# Patient Record
Sex: Female | Born: 1980 | Race: Black or African American | Hispanic: No | Marital: Single | Smoking: Never smoker
Health system: Southern US, Community
[De-identification: ages and names within clinical notes are randomized; demographics above are authoritative.]

## PROBLEM LIST (undated history)

## (undated) DIAGNOSIS — I10 Essential (primary) hypertension: Secondary | ICD-10-CM

## (undated) HISTORY — PX: CHOLECYSTECTOMY: SHX55

## (undated) HISTORY — PX: TUBAL LIGATION: SHX77

---

## 2002-05-01 ENCOUNTER — Other Ambulatory Visit: Admission: RE | Admit: 2002-05-01 | Discharge: 2002-05-01 | Payer: Self-pay | Admitting: Gynecology

## 2003-03-10 ENCOUNTER — Encounter: Payer: Self-pay | Admitting: Obstetrics & Gynecology

## 2003-03-10 ENCOUNTER — Inpatient Hospital Stay (HOSPITAL_COMMUNITY): Admission: AD | Admit: 2003-03-10 | Discharge: 2003-03-10 | Payer: Self-pay | Admitting: Obstetrics & Gynecology

## 2003-04-08 ENCOUNTER — Other Ambulatory Visit: Admission: RE | Admit: 2003-04-08 | Discharge: 2003-04-08 | Payer: Self-pay | Admitting: Obstetrics and Gynecology

## 2003-05-06 ENCOUNTER — Other Ambulatory Visit: Admission: RE | Admit: 2003-05-06 | Discharge: 2003-05-06 | Payer: Self-pay | Admitting: Obstetrics and Gynecology

## 2003-06-18 ENCOUNTER — Encounter: Payer: Self-pay | Admitting: Obstetrics and Gynecology

## 2003-06-18 ENCOUNTER — Ambulatory Visit (HOSPITAL_COMMUNITY): Admission: RE | Admit: 2003-06-18 | Discharge: 2003-06-18 | Payer: Self-pay | Admitting: Obstetrics and Gynecology

## 2003-07-22 ENCOUNTER — Ambulatory Visit (HOSPITAL_COMMUNITY): Admission: RE | Admit: 2003-07-22 | Discharge: 2003-07-22 | Payer: Self-pay | Admitting: Obstetrics and Gynecology

## 2003-08-27 ENCOUNTER — Other Ambulatory Visit: Admission: RE | Admit: 2003-08-27 | Discharge: 2003-08-27 | Payer: Self-pay | Admitting: Obstetrics and Gynecology

## 2003-10-07 ENCOUNTER — Ambulatory Visit (HOSPITAL_COMMUNITY): Admission: RE | Admit: 2003-10-07 | Discharge: 2003-10-07 | Payer: Self-pay | Admitting: Obstetrics and Gynecology

## 2003-10-12 ENCOUNTER — Inpatient Hospital Stay (HOSPITAL_COMMUNITY): Admission: AD | Admit: 2003-10-12 | Discharge: 2003-10-12 | Payer: Self-pay | Admitting: *Deleted

## 2003-10-23 ENCOUNTER — Ambulatory Visit (HOSPITAL_COMMUNITY): Admission: RE | Admit: 2003-10-23 | Discharge: 2003-10-23 | Payer: Self-pay | Admitting: Obstetrics and Gynecology

## 2003-10-24 ENCOUNTER — Inpatient Hospital Stay (HOSPITAL_COMMUNITY): Admission: AD | Admit: 2003-10-24 | Discharge: 2003-10-29 | Payer: Self-pay | Admitting: Obstetrics and Gynecology

## 2003-10-25 ENCOUNTER — Encounter (INDEPENDENT_AMBULATORY_CARE_PROVIDER_SITE_OTHER): Payer: Self-pay | Admitting: Specialist

## 2003-12-16 ENCOUNTER — Ambulatory Visit (HOSPITAL_COMMUNITY): Admission: RE | Admit: 2003-12-16 | Discharge: 2003-12-16 | Payer: Self-pay | Admitting: Obstetrics and Gynecology

## 2004-01-26 IMAGING — US US OB FOLLOW-UP
1 series · 13 of 28 positions shown · non-contrast
Comparison: none

CLINICAL DATA: Follow-up fetal anatomy.  G1 P0.

[Series 1: unknown · 0.38mm/px · 13 of 78 slices shown]
[im 3/78]
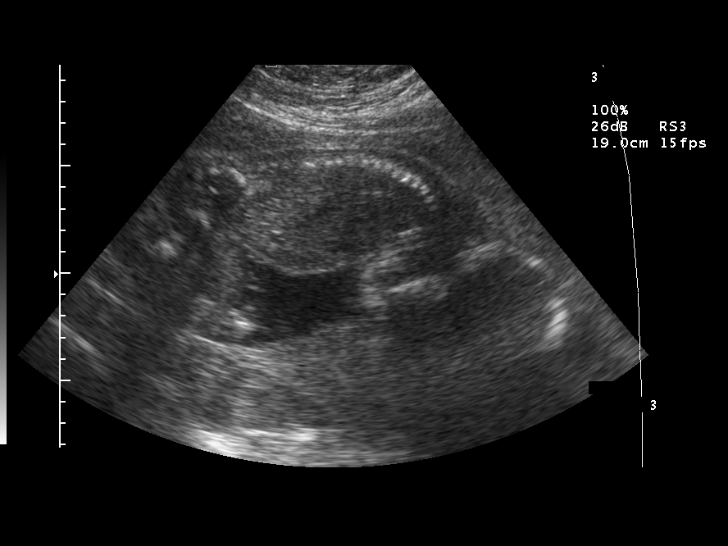
[im 9/78]
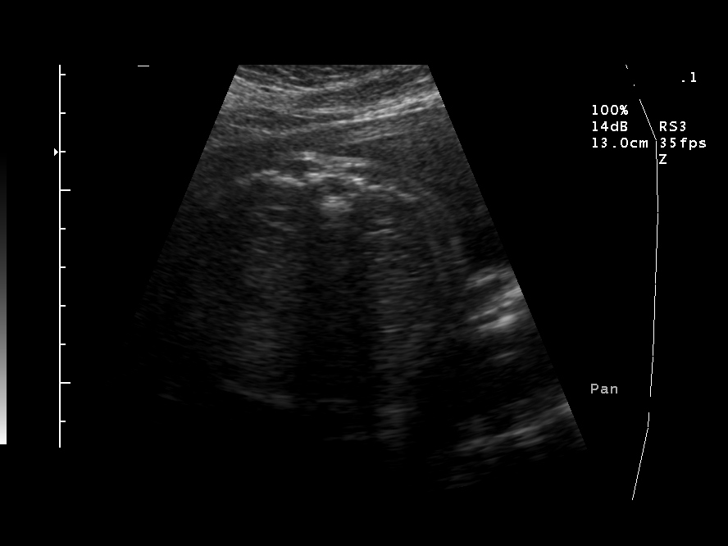
[im 15/78]
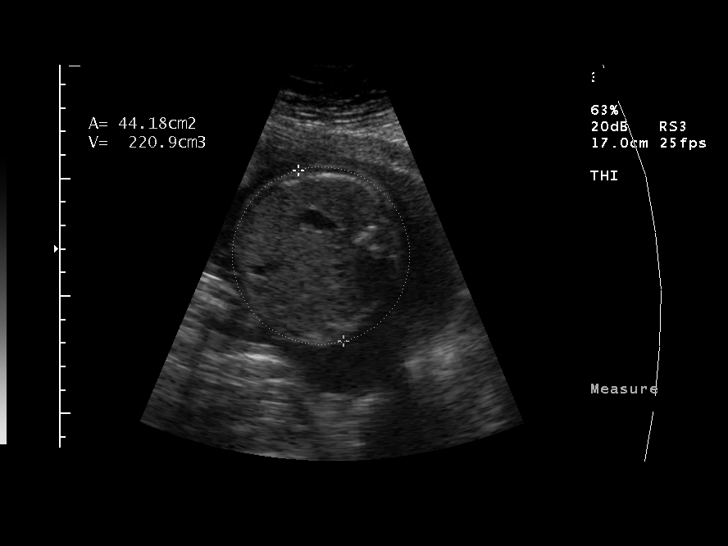
[im 20/78]
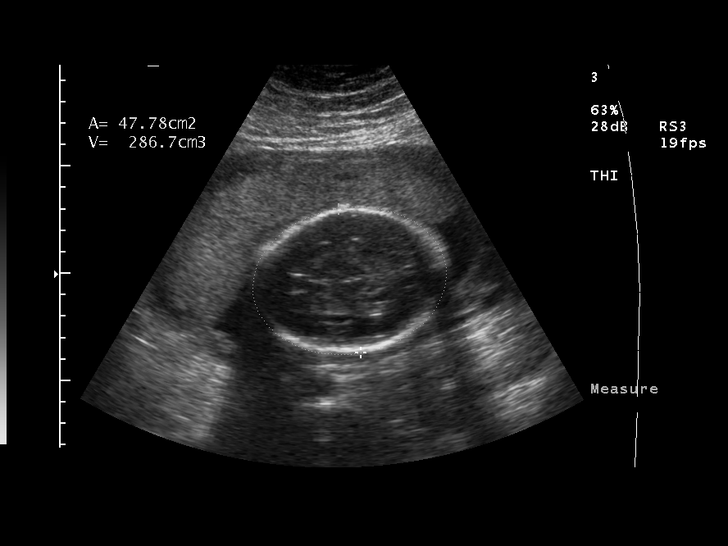
[im 26/78]
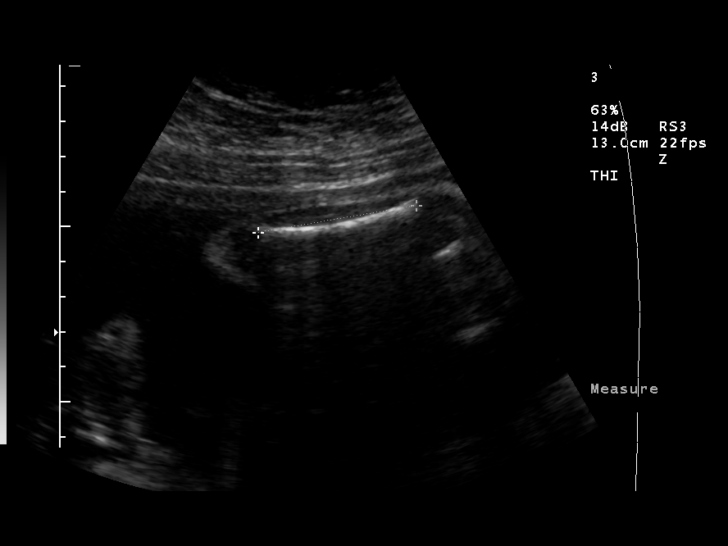
[im 32/78]
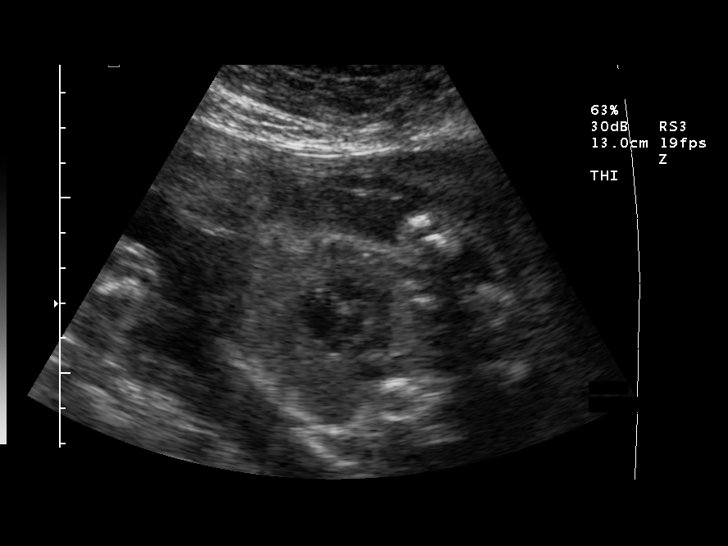
[im 40/78]
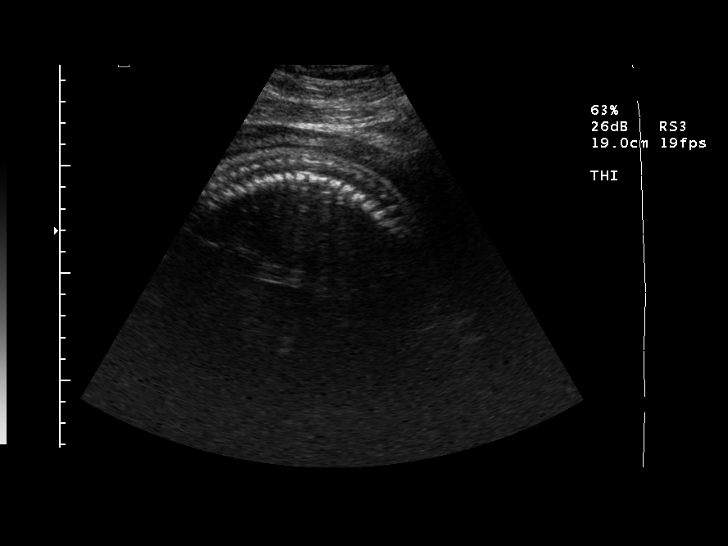
[im 46/78]
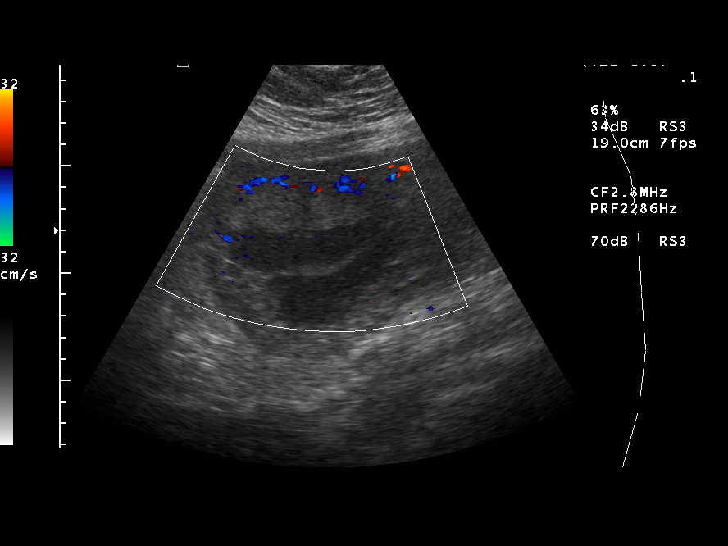
[im 52/78]
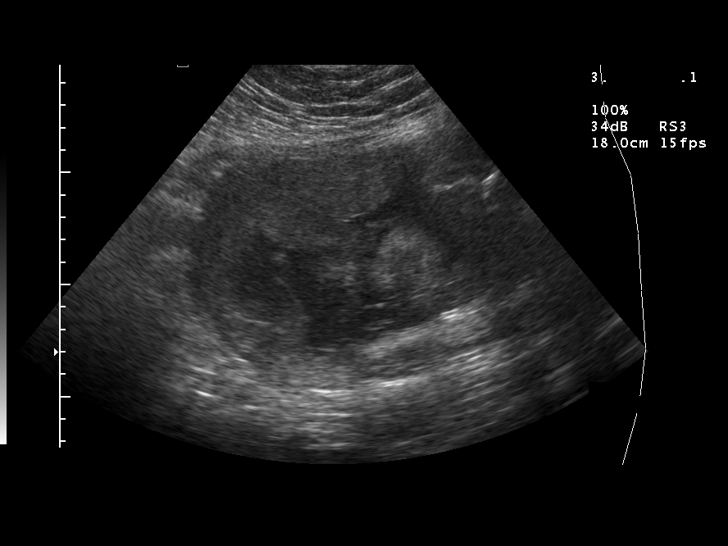
[im 58/78]
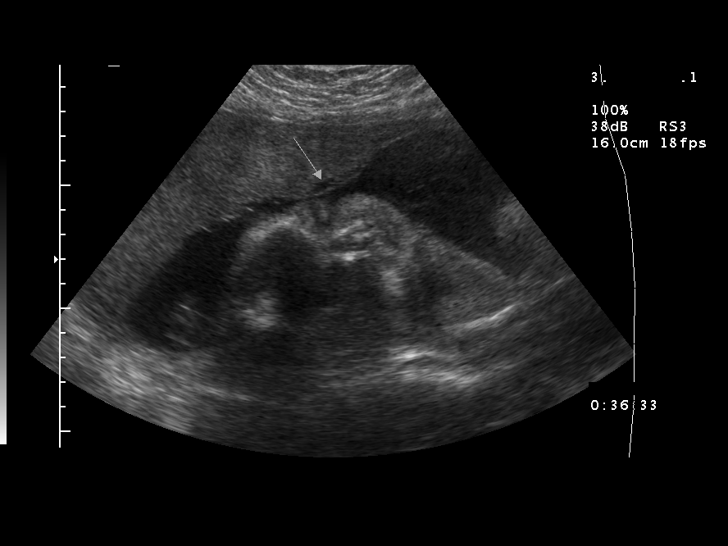
[im 63/78]
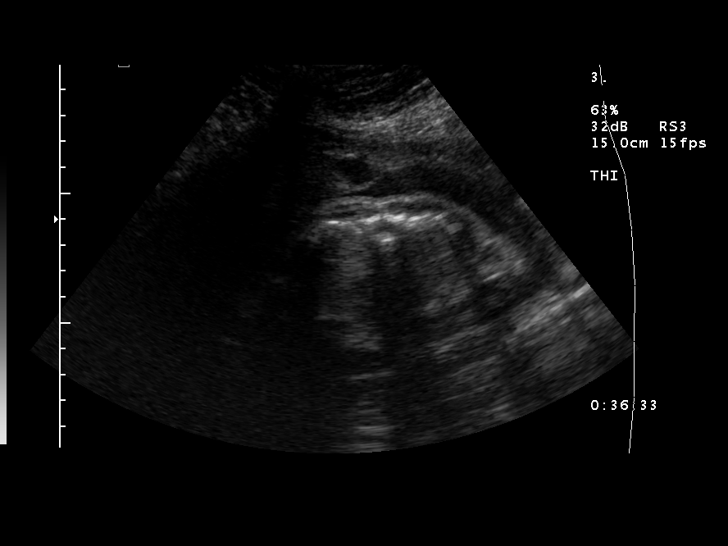
[im 69/78]
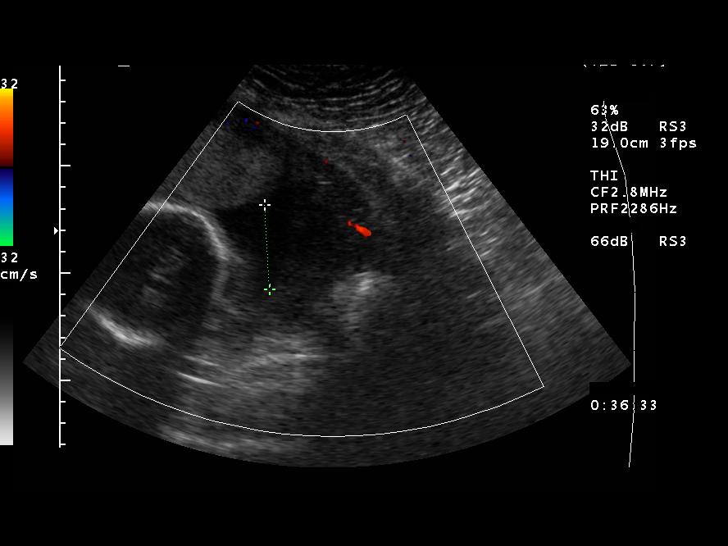
[im 75/78]
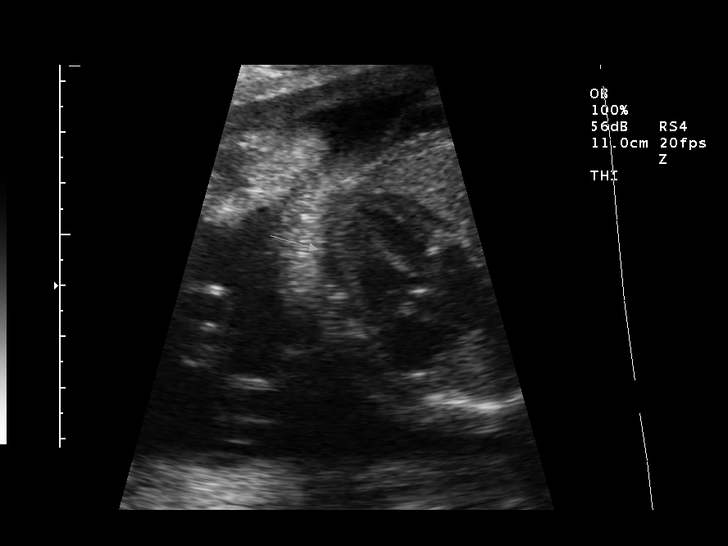

[13 of 28 positions shown; findings below may reference images not displayed]

OBSTETRICAL ULTRASOUND RE-EVALUATION

 NUMBER OF FETUSES:  1
 HEART RATE:  145  
 MOVEMENT: Yes
 BREATHING:  No
 PRESENTATION:  Variable
 PLACENTAL LOCATION:  Anterior
 GRADE:  I
 PREVIA:  No
 AMNIOTIC FLUID (subjective):  Normal
 AMNIOTIC FLUID (objective):  3.9 cm Vertical pocket 

 FETAL BIOMETRY
 BPD:  6.5 cm  26 w 3 d
 HC:  24.9 cm  27 w 0 d
 AC:  23.5 cm  27 w 6 d
 FL:  4.6 cm  25 w 1 d
 MEAN GA:  26 w 4 d
 ASSIGNED GA:  25 w 0 d (1st US)
 EFW:  993 g (H) Greater than 95th%ile ( 95th = 968 grams)  For 25 weeks

 FETAL ANATOMY
 LATERAL VENTRICLES:  Visualized 
 THALAMI/CSP:  Visualized 
 POSTERIOR FOSSA:  Visualized 
 NUCHAL REGION:  N/A
 SPINE:  Visualized 
 4 CHAMBER HEART ON LEFT:  Visualized 
 STOMACH ON LEFT:  Visualized 
 3 VESSEL CORD:  Visualized 
 CORD INSERTION SITE:  Visualized 
 KIDNEYS:  Visualized 
 BLADDER:  Visualized 
 EXTREMITIES:  Previously seen 

 ADDITIONAL ANATOMY VISUALIZED:  LVOT, RVOT, diaphragm, 5th digit, ductal arch, and aortic arch.

 EVALUATION LIMITED BY:    Maternal habitus, fetal position and advanced gestational age.

 MATERNAL FINDINGS
 CERVIX:  3.7 cm Transabdominally
IMPRESSION: Single living intrauterine fetus in variable presentation.  Patient is 25 weeks by first ultrasound at 5 weeks 6 days.  She measures 26 weeks 4 days today with a fetal weight of 993 grams which falls above the 95th percentile for 25 weeks.  One may wish to consider follow-up studies to assess interval growth.
 No focal fetal or placental abnormality identified although one should note that evaluation is limited due to maternal body habitus.

## 2004-02-16 ENCOUNTER — Emergency Department (HOSPITAL_COMMUNITY): Admission: EM | Admit: 2004-02-16 | Discharge: 2004-02-16 | Payer: Self-pay | Admitting: Emergency Medicine

## 2004-07-04 ENCOUNTER — Emergency Department (HOSPITAL_COMMUNITY): Admission: EM | Admit: 2004-07-04 | Discharge: 2004-07-04 | Payer: Self-pay | Admitting: Emergency Medicine

## 2004-07-06 ENCOUNTER — Emergency Department (HOSPITAL_COMMUNITY): Admission: EM | Admit: 2004-07-06 | Discharge: 2004-07-06 | Payer: Self-pay | Admitting: Family Medicine

## 2005-10-07 ENCOUNTER — Emergency Department (HOSPITAL_COMMUNITY): Admission: EM | Admit: 2005-10-07 | Discharge: 2005-10-07 | Payer: Self-pay | Admitting: Emergency Medicine

## 2005-10-09 ENCOUNTER — Other Ambulatory Visit: Admission: RE | Admit: 2005-10-09 | Discharge: 2005-10-09 | Payer: Self-pay | Admitting: Obstetrics and Gynecology

## 2005-11-01 ENCOUNTER — Ambulatory Visit (HOSPITAL_COMMUNITY): Admission: RE | Admit: 2005-11-01 | Discharge: 2005-11-01 | Payer: Self-pay | Admitting: Obstetrics and Gynecology

## 2005-12-13 ENCOUNTER — Ambulatory Visit (HOSPITAL_COMMUNITY): Admission: RE | Admit: 2005-12-13 | Discharge: 2005-12-13 | Payer: Self-pay | Admitting: Obstetrics and Gynecology

## 2006-02-09 ENCOUNTER — Ambulatory Visit (HOSPITAL_COMMUNITY): Admission: RE | Admit: 2006-02-09 | Discharge: 2006-02-09 | Payer: Self-pay | Admitting: Obstetrics and Gynecology

## 2006-02-26 ENCOUNTER — Inpatient Hospital Stay (HOSPITAL_COMMUNITY): Admission: AD | Admit: 2006-02-26 | Discharge: 2006-02-27 | Payer: Self-pay | Admitting: Obstetrics and Gynecology

## 2006-03-20 ENCOUNTER — Ambulatory Visit (HOSPITAL_COMMUNITY): Admission: RE | Admit: 2006-03-20 | Discharge: 2006-03-20 | Payer: Self-pay | Admitting: Obstetrics and Gynecology

## 2006-03-30 ENCOUNTER — Ambulatory Visit (HOSPITAL_COMMUNITY): Admission: RE | Admit: 2006-03-30 | Discharge: 2006-03-30 | Payer: Self-pay | Admitting: Obstetrics and Gynecology

## 2006-04-17 ENCOUNTER — Ambulatory Visit (HOSPITAL_COMMUNITY): Admission: RE | Admit: 2006-04-17 | Discharge: 2006-04-17 | Payer: Self-pay | Admitting: Obstetrics and Gynecology

## 2006-04-24 ENCOUNTER — Ambulatory Visit (HOSPITAL_COMMUNITY): Admission: RE | Admit: 2006-04-24 | Discharge: 2006-04-24 | Payer: Self-pay | Admitting: Obstetrics and Gynecology

## 2006-04-25 ENCOUNTER — Inpatient Hospital Stay (HOSPITAL_COMMUNITY): Admission: AD | Admit: 2006-04-25 | Discharge: 2006-04-28 | Payer: Self-pay | Admitting: Obstetrics and Gynecology

## 2006-04-25 ENCOUNTER — Encounter (INDEPENDENT_AMBULATORY_CARE_PROVIDER_SITE_OTHER): Payer: Self-pay | Admitting: Specialist

## 2006-06-05 ENCOUNTER — Other Ambulatory Visit: Admission: RE | Admit: 2006-06-05 | Discharge: 2006-06-05 | Payer: Self-pay | Admitting: Obstetrics and Gynecology

## 2006-06-19 IMAGING — US US OB COMP +14 WK
1 series · 13 of 28 positions shown · non-contrast
Comparison: none

CLINICAL DATA: 20 week 4 day assigned gestational age by prior ultrasound.  Maternal obesity.  Evaluate fetal anatomy and growth.

[Series 1: us ob comp +14 wk · 0.43mm/px · 13 of 68 slices shown]
[im 3/68]
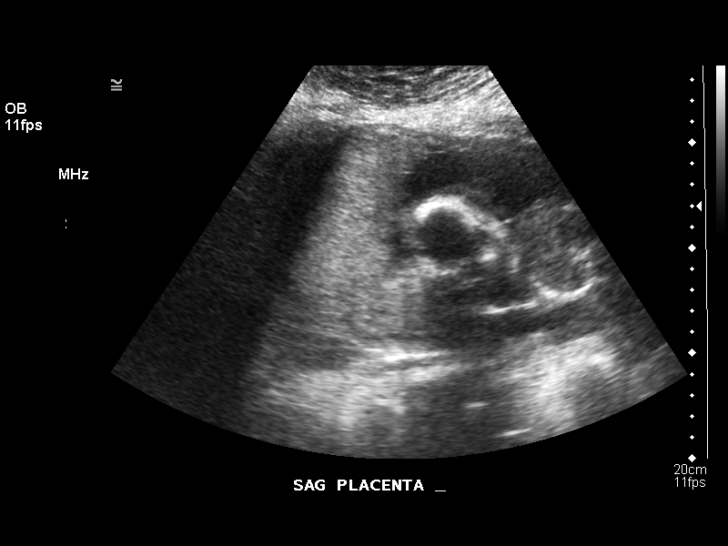
[im 8/68]
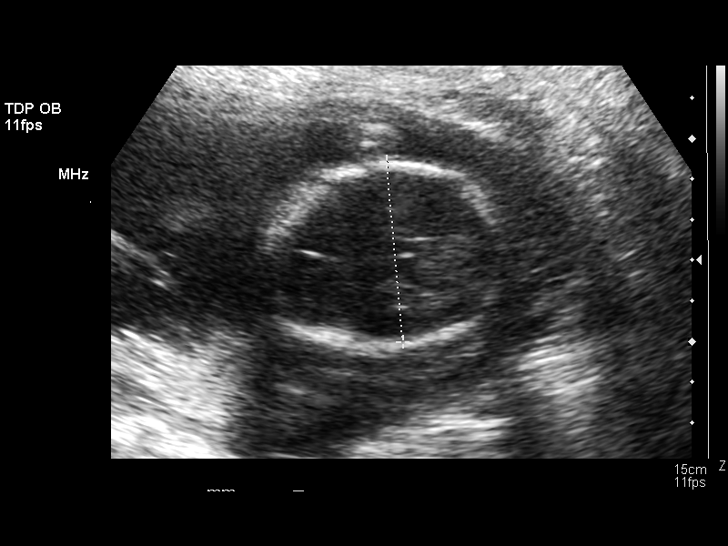
[im 13/68]
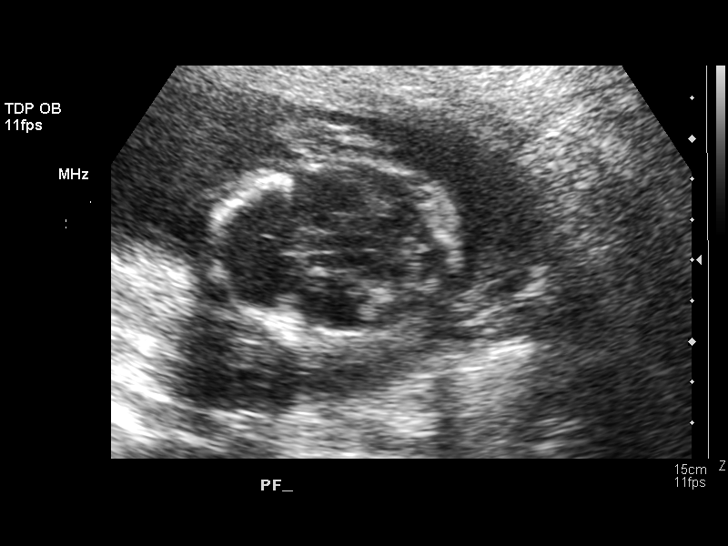
[im 18/68]
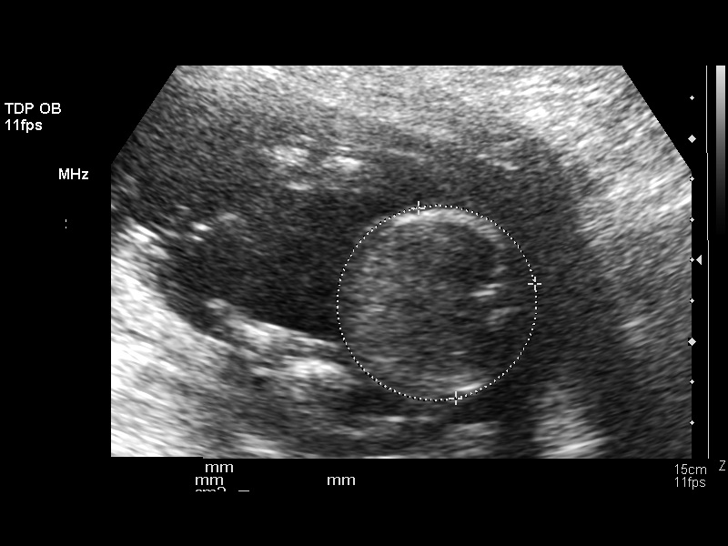
[im 23/68]
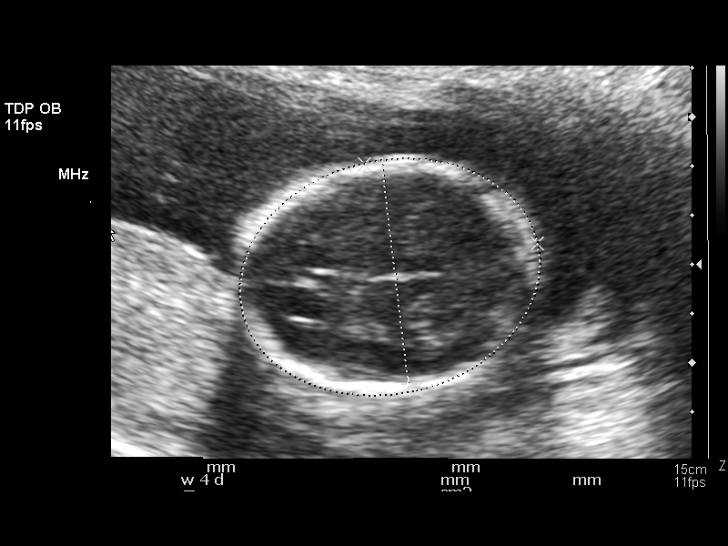
[im 28/68]
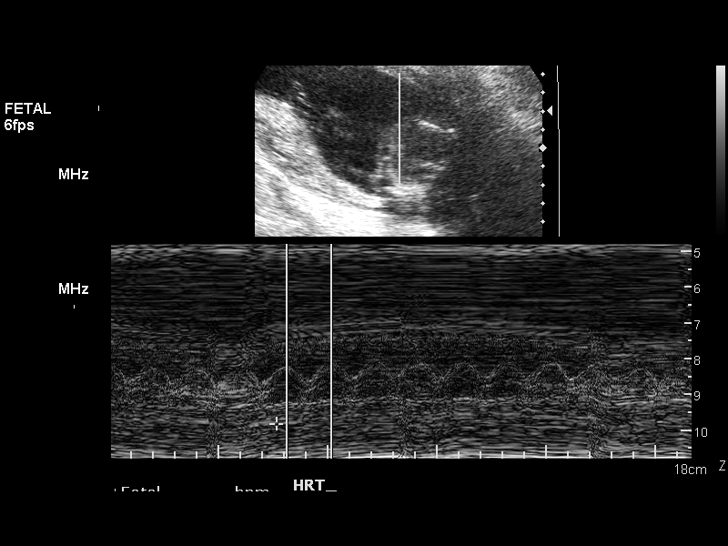
[im 35/68]
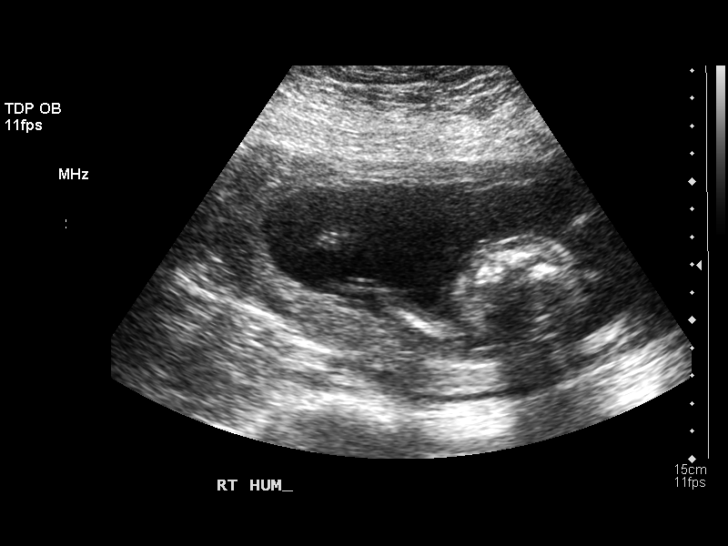
[im 40/68]
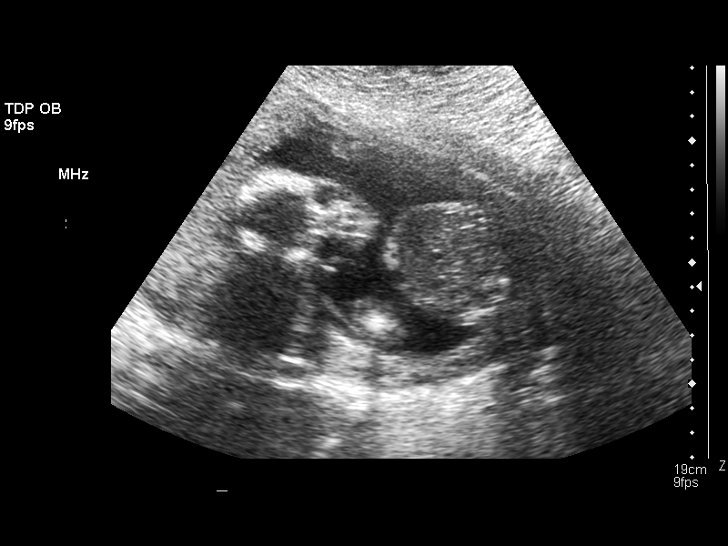
[im 45/68]
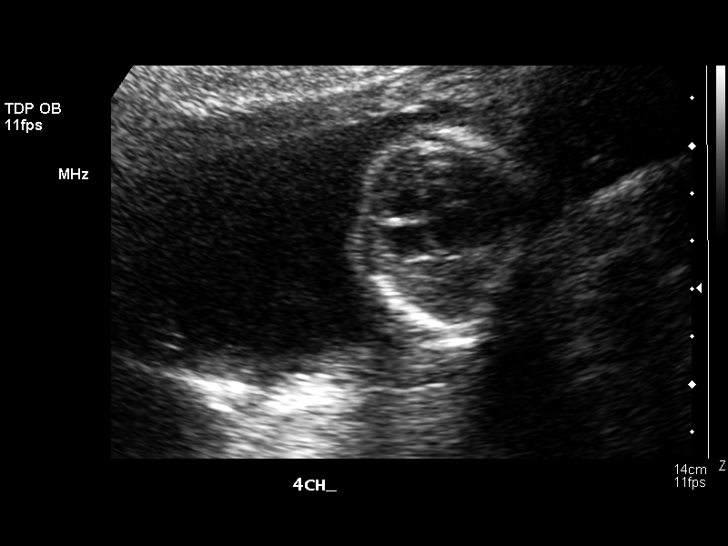
[im 50/68]
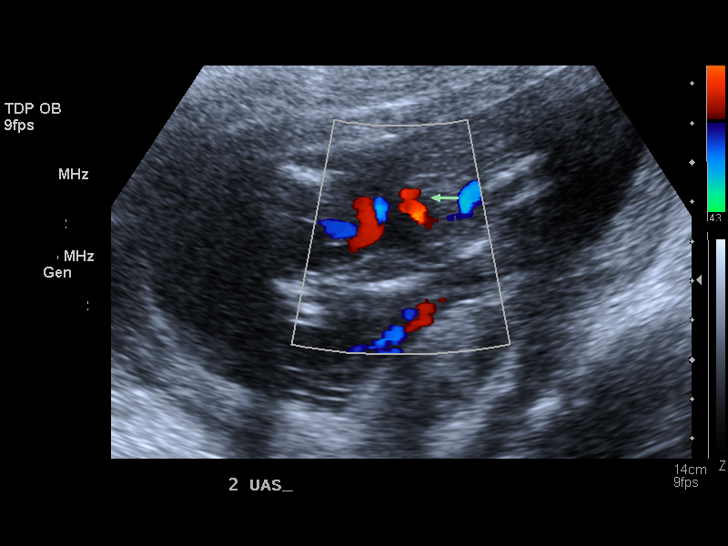
[im 55/68]
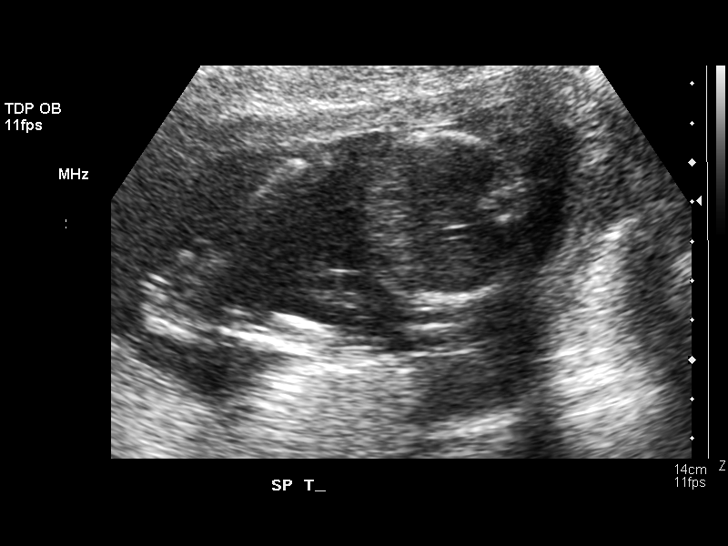
[im 60/68]
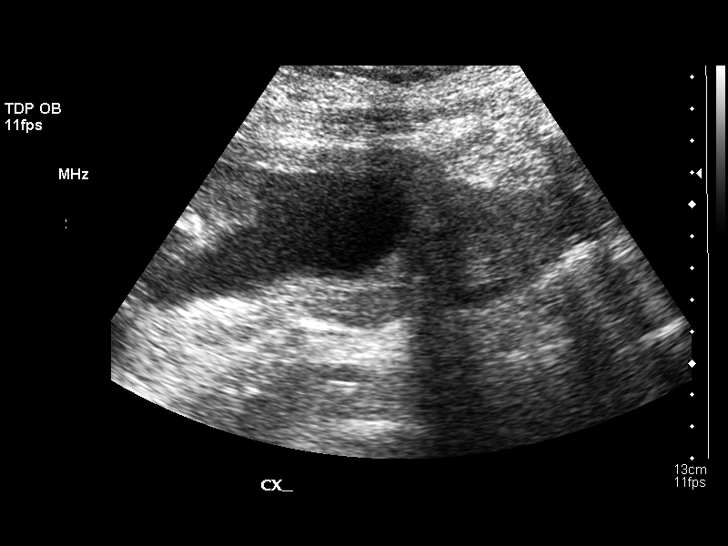
[im 65/68]
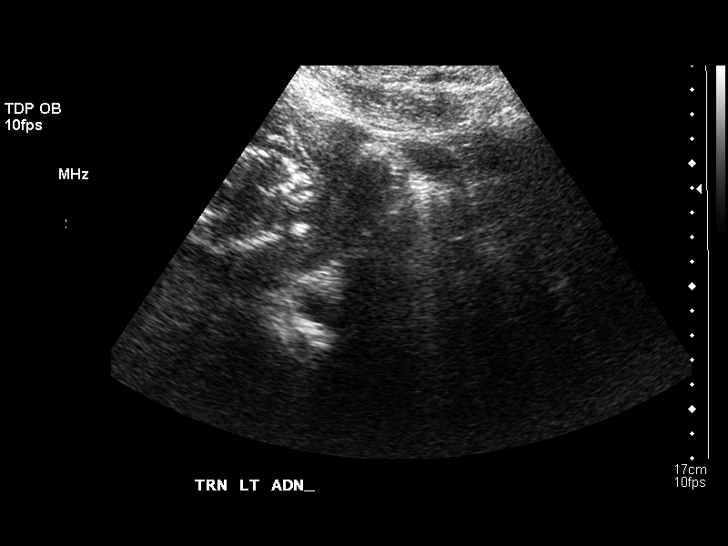

[13 of 28 positions shown; findings below may reference images not displayed]

OBSTETRICAL ULTRASOUND:
 Number of Fetuses: 1
 Heart Rate:  147
 Movement:  Yes
 Breathing:  No  
 Presentation:  Transverse
 Placental Location:  Fundal
 Grade:  I
 Previa: No
 Amniotic Fluid (Subjective): Normal
 Amniotic Fluid (Objective):   5.2 cm Vertical pocket 

 FETAL BIOMETRY
 BPD:   4.5 cm   19 w 3 d
 HC:   16.9 cm   19 w 4 d
 AC:   15.0 cm   20 w 2 d
 FL:    3.2 cm   19 w 6 d

 MEAN GA:  19 w 6 d  US EDC:  05/03/06
 Assigned GA:  20 w 4 d  Assigned EDC:  04/28/06    

 FETAL ANATOMY
 Lateral Ventricles:    Visualized 
 Thalami/CSP:      Visualized 
 Posterior Fossa:  Visualized 
 Nuchal Region:    Visualized 
 Spine:      Visualized 
 4 Chamber Heart on Left:      Visualized 
 Stomach on Left:      Visualized 
 3 Vessel Cord:    Visualized 
 Cord Insertion site:    Visualized 
 Kidneys:  Visualized 
 Bladder:  Visualized 
 Extremities:      Visualized 

 ADDITIONAL ANATOMY VISUALIZED:  RVOT, upper lip, orbits, profile, and diaphragm.  

 Evaluation limited by:  Maternal habitus

 MATERNAL UTERINE AND ADNEXAL FINDINGS
 Cervix: 4.5 cm Transabdominally
IMPRESSION: 1.  Assigned gestational age is currently 20 weeks 4 days by prior ultrasound.  Appropriate fetal growth.
 2.  No evidence of fetal anatomic abnormality.

## 2006-09-24 IMAGING — US US FETAL BPP W/O NONSTRESS
1 series · 5 of 5 positions shown · non-contrast
Comparison: none

CLINICAL DATA: Nonreactive stress test today; assigned gestational age is 33 weeks 4 days.

[Series 1: us fetal bpp w/o nonstress · 5 acquisitions, 5 frames shown]
[im 1/5]
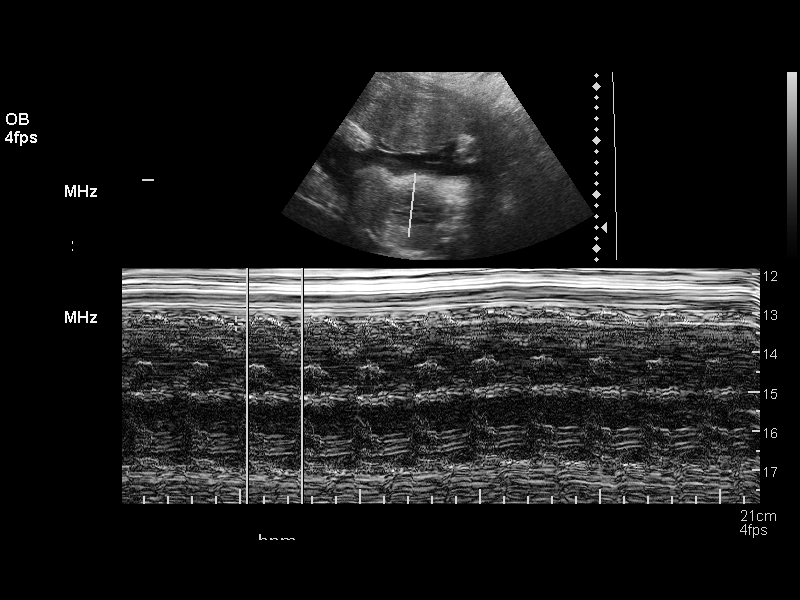
[im 2/5]
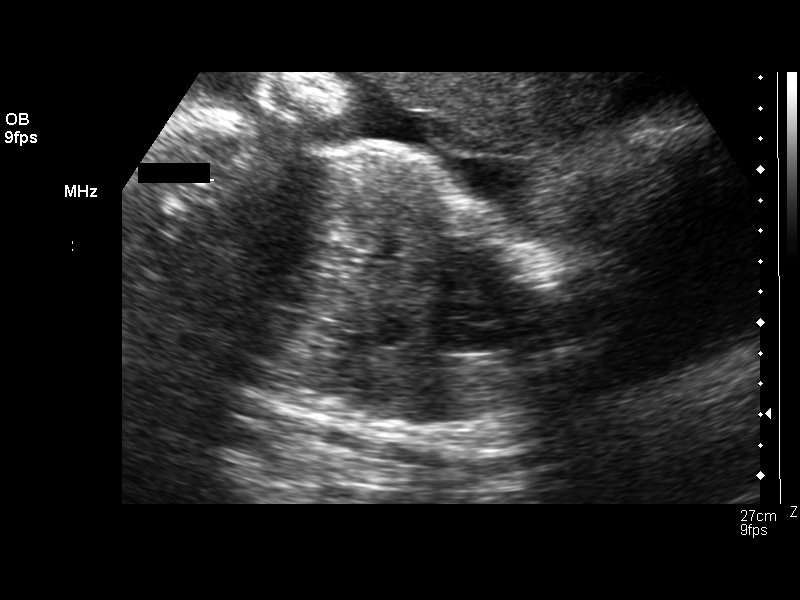
[im 3/5]
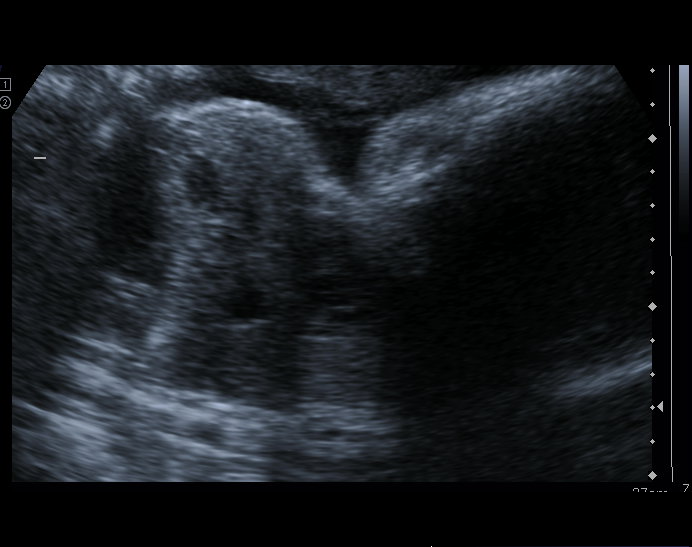
[im 4/5]
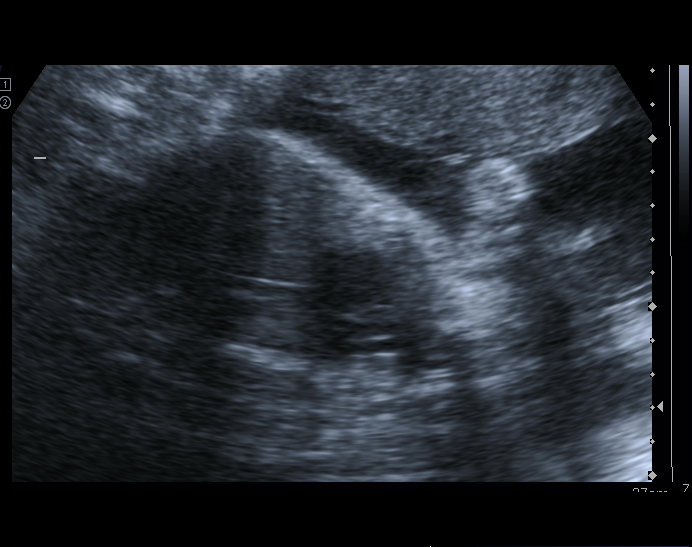
[im 5/5]
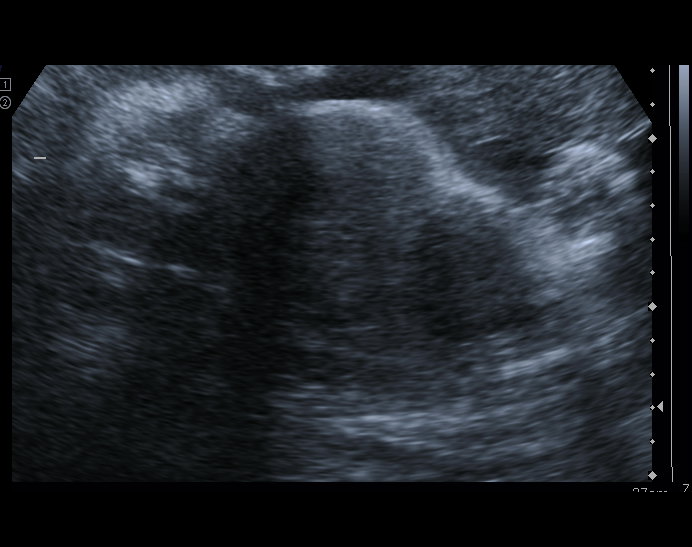

[5 of 5 positions shown; findings below may reference images not displayed]

BIOPHYSICAL PROFILE

 Number of Fetuses: 1
 Heart rate:  131
 Movement:  Yes
 Breathing:  Yes
 Presentation:  Cephalic
 Placental Location:  Anterior
 Grade:  II
 Previa:  No
 Amniotic Fluid (Subjective):  Normal
 Amniotic Fluid (Objective):  23.7 cm AFI 

 Fetal measurements and complete anatomic evaluation were not requested.  

 BPP SCORING
 Movements:  2  Time:  10 minutes
 Breathing:  2
 Tone:  2
 Amniotic Fluid:  2
 Total Score: 8

 MATERNAL UTERINE AND ADNEXAL FINDINGS
 Cervix:  Not evaluated (Performed earlier today)
IMPRESSION: The biophysical profile score is [DATE] over a 10 minute period.

## 2007-10-17 ENCOUNTER — Emergency Department (HOSPITAL_COMMUNITY): Admission: EM | Admit: 2007-10-17 | Discharge: 2007-10-18 | Payer: Self-pay | Admitting: Family Medicine

## 2009-08-24 ENCOUNTER — Ambulatory Visit (HOSPITAL_COMMUNITY): Admission: RE | Admit: 2009-08-24 | Discharge: 2009-08-24 | Payer: Self-pay | Admitting: Obstetrics and Gynecology

## 2009-10-20 ENCOUNTER — Ambulatory Visit (HOSPITAL_COMMUNITY): Admission: RE | Admit: 2009-10-20 | Discharge: 2009-10-20 | Payer: Self-pay | Admitting: Obstetrics and Gynecology

## 2009-11-17 ENCOUNTER — Encounter: Payer: Self-pay | Admitting: Obstetrics and Gynecology

## 2009-11-17 ENCOUNTER — Observation Stay (HOSPITAL_COMMUNITY): Admission: AD | Admit: 2009-11-17 | Discharge: 2009-11-18 | Payer: Self-pay | Admitting: Obstetrics & Gynecology

## 2009-11-23 ENCOUNTER — Inpatient Hospital Stay (HOSPITAL_COMMUNITY): Admission: AD | Admit: 2009-11-23 | Discharge: 2009-11-23 | Payer: Self-pay | Admitting: Obstetrics & Gynecology

## 2010-01-03 ENCOUNTER — Encounter (INDEPENDENT_AMBULATORY_CARE_PROVIDER_SITE_OTHER): Payer: Self-pay | Admitting: Obstetrics and Gynecology

## 2010-01-03 ENCOUNTER — Inpatient Hospital Stay (HOSPITAL_COMMUNITY): Admission: RE | Admit: 2010-01-03 | Discharge: 2010-01-06 | Payer: Self-pay | Admitting: Obstetrics and Gynecology

## 2010-09-18 ENCOUNTER — Encounter: Payer: Self-pay | Admitting: Obstetrics and Gynecology

## 2010-11-15 LAB — CBC
HCT: 28.1 % — ABNORMAL LOW (ref 36.0–46.0)
MCHC: 33.5 g/dL (ref 30.0–36.0)
MCHC: 34 g/dL (ref 30.0–36.0)
MCV: 80.1 fL (ref 78.0–100.0)
MCV: 81 fL (ref 78.0–100.0)
RBC: 4.18 MIL/uL (ref 3.87–5.11)
RDW: 15.9 % — ABNORMAL HIGH (ref 11.5–15.5)
RDW: 16.4 % — ABNORMAL HIGH (ref 11.5–15.5)

## 2010-11-15 LAB — RPR: RPR Ser Ql: NONREACTIVE

## 2010-11-20 LAB — CBC
HCT: 33.4 % — ABNORMAL LOW (ref 36.0–46.0)
Hemoglobin: 11.1 g/dL — ABNORMAL LOW (ref 12.0–15.0)
MCHC: 33.3 g/dL (ref 30.0–36.0)
RDW: 16.4 % — ABNORMAL HIGH (ref 11.5–15.5)

## 2010-11-20 LAB — COMPREHENSIVE METABOLIC PANEL
ALT: 23 U/L (ref 0–35)
Alkaline Phosphatase: 91 U/L (ref 39–117)
CO2: 25 mEq/L (ref 19–32)
GFR calc non Af Amer: >60 mL/min (ref >60)
Glucose, Bld: 112 mg/dL — ABNORMAL HIGH (ref 70–99)
Potassium: 3.6 mEq/L (ref 3.5–5.1)
Sodium: 135 mEq/L (ref 135–145)

## 2010-11-20 LAB — RAPID URINE DRUG SCREEN, HOSP PERFORMED
Amphetamines: NOT DETECTED
Barbiturates: NOT DETECTED
Cocaine: NOT DETECTED
Opiates: NOT DETECTED

## 2010-11-20 LAB — T3, FREE: T3, Free: 2.8 pg/mL (ref 2.3–4.2)

## 2011-01-13 NOTE — Op Note (Signed)
NAMESHAMERE, DILWORTH           ACCOUNT NO.:  [id number]   MEDICAL RECORD NO.:  [id number]          PATIENT TYPE:  INP   LOCATION:  9125                          FACILITY:  [facility]   PHYSICIAN:  [name]. Ashley Royalty, M.D.DATE OF BIRTH:  04/22/1981   DATE OF PROCEDURE:  04/25/2006  DATE OF DISCHARGE:  04/24/2006                                 OPERATIVE REPORT   PREOPERATIVE DIAGNOSES:  1. Intrauterine pregnancy at [redacted] weeks gestation.  2. Previous cesarean section.  3. Morbid obesity.   POSTOPERATIVE DIAGNOSES:  1. Intrauterine pregnancy at [redacted] weeks gestation.  2. Previous cesarean section.  3. Morbid obesity.  4. Meconium-stained amniotic fluid.   PROCEDURE:  Repeat low transverse cesarean section.   SURGEON:  [name]. Ashley Royalty, M.D.   ASSISTANT:  Bing Neighbors. Delcambre, MD   ANESTHESIA:  Spinal.   FINDINGS:  7 pounds 1 ounce female, Apgars 8 at one minute, 8 and five  minutes, sent to newborn nursery.  Arterial cord pH 7.24.   ESTIMATED BLOOD LOSS:  800 mL.   COMPLICATIONS:  None.   PACKS DRAINS:  Foley.   Sponge, needle and instrument counts were reported correct x2.   PROCEDURE:  The patient was taken to the operating room, placed in the  sitting position.  After spinal anesthetic was administered she was placed  in the dorsal supine and prepped and draped in usual manner for abdominal  surgery.  The pannus was taped cephalad.  Foley catheter was placed.  After  the patient was properly draped, the Pfannenstiel incision was made cephalad  to the previous cesarean incision.  The subcutaneous tissues were sharply  and bluntly dissected down to fascia which was nicked with knife and incised  transversely with Mayo scissors.  The underlying rectus muscles were  separated from the fascia using sharp and blunt dissection.  The rectus  muscles were separated midline exposing the peritoneum which was elevated  with hemostats and entered atraumatically with Metzenbaum scissors.   The  incision was extended longitudinally.  Next the Alexis retractor was placed  in the abdominal cavity.  The uterus was identified and bladder flap created  by incising intrauterine serosa and sharply and bluntly dissecting the  bladder inferiorly.  Held in place with a bladder blade.  The uterus was  then entered through a low transverse incision using sharp and blunt  dissection.  Thin meconium was encountered.  The infant was then delivered  from vertex presentation.  At delivery of head the DeLee suction was used.  Full delivery was then accomplished.  The cord was triply clamped, cut and  the infant given immediately to the awaiting pediatrics team, Dr. Eric Form in  attendance.  Arterial cord pH was obtained from isolated segment.  Then a  regular cord blood was obtained and placenta and membranes were removed in  entirety and sent to pathology for histologic studies.  The uterus was  exteriorized.  The uterus was closed with two running layers of #1 Vicryl.  The first was a running locking layer.  The second was a running,  intermittently locking, and imbricating layer.  Two  venous bleeders in the  right aspect of the incision were separately ligated to ensure hemostasis.  Several figure-of-eight sutures were then employed to obtain hemostasis.  Hemostasis was noted.  Uterus tubes and ovaries were inspected and found be  otherwise normal.  They were returned the abdominal cavity.  Copious  irrigation was accomplished.  Hemostasis was noted.  Next the Alexis  retractor was removed.  The peritoneum was then closed with 3-0 Vicryl in a  running fashion.  The fascia was closed with 0 Vicryl in running fashion.  The skin was closed with staples.   The patient tolerated the procedure extremely well and was returned to the  recovery room in good condition.  At the conclusion of the procedure the  urine was clear and copious.      James A. Ashley Royalty, M.D.  Electronically  Signed     JAM/MEDQ  D:  04/25/2006  T:  04/26/2006  Job:  [id number]

## 2011-01-13 NOTE — Discharge Summary (Signed)
Natasha Rose, STARKEY           ACCOUNT NO.:  [id number]   MEDICAL RECORD NO.:  [id number]          PATIENT TYPE:  INP   LOCATION:  9125                          [facility]   PHYSICIAN:  [name]. Ashley Royalty, M.D.DATE OF BIRTH:  09/16/1980   DATE OF ADMISSION:  04/25/2006  DATE OF DISCHARGE:  04/28/2006                                 DISCHARGE SUMMARY   DISCHARGE DIAGNOSES:  1. Intrauterine pregnancy at [redacted] weeks gestation.  2. Previous cesarean section.  3. Hypertension.  4. Sickle trait.  5. Cholelithiasis.  6. History of childhood tetralogy of Fallot with normal anatomic screen in      current pregnancy.  7. Morbid obesity.   OPERATIONS AND SPECIAL PROCEDURES:  Repeat low transverse cesarean section.   CONSULTATIONS:  None.   DISCHARGE MEDICATIONS:  1. Percocet.  2. Motrin 600 mg.  3. Iron sulfate.   HISTORY AND PHYSICAL:  This is a 24-year gravida 2, para 1 at [redacted] weeks  gestation with the aforementioned diagnoses.  She has been maintained on  Aldomet throughout the pregnancy.  She is admitted for repeat cesarean  section at [redacted] weeks gestation.  For the remainder of the history and  physical please see chart.   HOSPITAL COURSE:  The patient was admitted to [facility]. Admission laboratory studies were drawn.  The patient was taken  to the operating room on 04/25/2006 and underwent repeat low transverse  cesarean section.  The procedure yielded a 7 pound 1 ounce female, Apgars 8  at one minute, 8 at five minutes, sent to newborn nursery.  There was  meconium-stained amniotic fluid.  The arterial cord pH was 7.24.  The  patient's postpartum course was benign.  She was discharged on the third  postpartum day afebrile and in satisfactory condition.   DISPOSITION:  The patient is to return to the office on 05/01/2006 to remove  staples and of course in 6 weeks for postpartum examination.      James A. Ashley Royalty, M.D.  Electronically  Signed    JAM/MEDQ  D:  05/23/2006  T:  05/25/2006  Job:  [id number]

## 2011-01-13 NOTE — Op Note (Signed)
Natasha Rose, Natasha Rose                       ACCOUNT NO.:  [id number]   MEDICAL RECORD NO.:  [id number]                   PATIENT TYPE:  INP   LOCATION:  9144                                 [facility]   PHYSICIAN:  Naima A. Dillard, M.D.              DATE OF BIRTH:  [birth year]   DATE OF PROCEDURE:  10/25/2003  DATE OF DISCHARGE:                                 OPERATIVE REPORT   PREOPERATIVE DIAGNOSIS:  Intrauterine pregnancy at term with nonreassuring  fetal heart tones.   POSTOPERATIVE DIAGNOSIS:  Intrauterine pregnancy at term with nonreassuring  fetal heart tones.   PROCEDURE:  Primary low transverse cesarean section.   ANESTHESIA:  Epidural.   IV FLUIDS:  1500 mL of crystalloid.   ESTIMATED BLOOD LOSS:  800 mL.   URINE OUTPUT:  225 mL clear urine at the end of the procedure.   FINDINGS:  A female infant, vertex presentation with clear fluid.  Apgars of 9  and 9.  Cord pH of 7.28, normal-appearing uterus, tubes, and ovaries  bilaterally.   PROCEDURE IN DETAIL:  The patient was taken to the operating room, prepped  and draped in the normal sterile fashion, placed in the dorsal supine  position with a left lateral tilt.  A Pfannenstiel skin incision was then  made with a scalpel and carried down to the fascia using Bovie cautery.  The  fascia was incised in the midline and extended bilaterally.  Kochers x 2  were placed in the superior aspect of the fascia which was elevated off the  rectus muscle both bluntly and sharply, and the inferior aspect of the  fascia was dissected off the rectus muscle in a similar fashion.  Rectus  muscles were separated in the midline, peritoneum identified, tented up,  entered sharply, and extended superiorly and inferiorly with good  visualization of bowel and bladder.  Vesicouterine peritoneum was identified  to the left and entered sharply and extended bilaterally.  Bladder blade was  inserted.  A primary lower transverse uterine  incision was then made with a  scalpel and extended transversely bilaterally bluntly.  The infant was  delivered without difficulties.  No meconium, no nuchal cord.  The cord was  clamped and cut, handed over to pediatricians in attendance.  Cord pH was  obtained.  Cord blood was obtained.  Placenta was manually removed.  The  uterus was cleared of all clot and debris.  Uterus was repaired with 0  Vicryl in a running lock fashion.  A second layer of 0 Vicryl was used to  imbricate the uterus.  The abdomen was irrigated with copious saline.  All  areas were noted to be hemostatic.  The peritoneum was closed with 0  chromic.  The fascia was closed with 0 Vicryl in a running fashion.  Subcutaneous tissue was irrigated and made hemostatic with Bovie cautery.  A  size 10 Jackson-Pratt drain was placed into  the subcutaneous layer.  The  skin was closed with staples.  Sponge, lap, and needle counts were correct x  2.  The patient went to recovery room in stable condition.                                               Naima A. Normand Sloop, M.D.   NAD/MEDQ  D:  10/25/2003  T:  10/25/2003  Job:  [id number]

## 2011-01-13 NOTE — H&P (Signed)
NAMEJAHMYA, Natasha Rose           ACCOUNT NO.:  [id number]   MEDICAL RECORD NO.:  [id number]          PATIENT TYPE:  OUT   LOCATION:  ULT                           [facility]   PHYSICIAN:  James A. Ashley Royalty, M.D.DATE OF BIRTH:  07/15/1981   DATE OF ADMISSION:  04/24/2006  DATE OF DISCHARGE:                                HISTORY & PHYSICAL   HISTORY OF PRESENT ILLNESS:  This is a 30 year old gravida 2, para 2, EDC  May 04, 2006, approximately [redacted] weeks gestation. Prenatal care has been  complicated by morbid obesity, cholelithiasis, sickle trait, previous  cesarean section and hypertension. Patient also has a child with tetralogy  of Fallot. She has been followed for hypertension by Vesta Mixer, M.D.  at [facility]. She has been maintained most recently on Aldomet  500 mg p.o. t.i.d.. She had an ultrasound performed at or about [redacted] weeks  gestation at [facility]. All anatomic features were noted to be  satisfactory including cardiac anatomy. The patient is for repeat cesarean  section.   MEDICATIONS:  1. As above.  2. Prenatal vitamins.   PAST MEDICAL HISTORY:  As above.   ALLERGIES:  NONE.   FAMILY HISTORY:  Noncontributory.   SOCIAL HISTORY:  Patient denies use of tobacco or significant alcohol.   REVIEW OF SYSTEMS:  Noncontributory.   PHYSICAL EXAMINATION:  GENERAL: Patient is a morbidly obesity pleasant black  female in no acute distress.  VITAL SIGNS: Afebrile, vital signs stable. Weight approximately 350 pounds.  CHEST: Lungs are clear.  CARDIAC: Regular rate and rhythm.  ABDOMEN: Gravid with a term fundal height. Great fetal heart tones were  auscultated.  MUSCULOSKELETAL: No CVA tenderness.  PELVIC: Examination is deferred.   IMPRESSION:  1. Intrauterine pregnancy, approximately [redacted] weeks gestation.  2. Previous cesarean section.  3. Hypertension.  4. Sickle trait.  5. Cholelithiasis.  6. History of child with tetralogy of Fallot  with normal anatomic screen      in current pregnancy.   PLAN:  Repeat low transverse cesarean section. Risks, benefits,  complications and alternatives fully discussed with the patient. She states  she understands and accepts. Questions provided and answered.      James A. Ashley Royalty, M.D.  Electronically Signed     JAM/MEDQ  D:  04/25/2006  T:  04/25/2006  Job:  [id number]

## 2011-01-13 NOTE — H&P (Signed)
[name], Natasha Rose           ACCOUNT NO.:  [id number]   MEDICAL RECORD NO.:  [id number]          PATIENT TYPE:  OUT   LOCATION:  ULT                           [facility]   PHYSICIAN:  James A. Ashley Royalty, M.D.DATE OF BIRTH:  Jan 05, 1981   DATE OF ADMISSION:  04/24/2006  DATE OF DISCHARGE:                                HISTORY & PHYSICAL   This is a 30 year old gravida 2, para 1, EDC May 04, 2006,  approximately 39-weeks' gestation. Prenatal care has been complicated by  morbid obesity, sickle trait, hypertension, and previous cesarean section.  The patient has a child with tetralogy of Fallot. Hypertension has been  managed by Vesta Mixer, M.D., at [facility]. The patient has  been maintained most recently on Aldomet 500 mg p.o. t.i.d.      [name]. Ashley Royalty, M.D.     JAM/MEDQ  D:  04/25/2006  T:  04/25/2006  Job:  [id number]

## 2011-01-13 NOTE — Discharge Summary (Signed)
Natasha Rose, Natasha Rose                       ACCOUNT NO.:  [id number]   MEDICAL RECORD NO.:  [id number]                   PATIENT TYPE:  INP   LOCATION:  9144                                 [facility]   PHYSICIAN:  Osborn Coho, M.D.                DATE OF BIRTH:  1981-05-18   DATE OF ADMISSION:  10/24/2003  DATE OF DISCHARGE:  10/29/2003                                 DISCHARGE SUMMARY   ADMISSION DIAGNOSES:  1. Intrauterine pregnancy at term.  2. Premature rupture of the membranes.  3. Early labor.  4. History of recent development of pregnancy-induced hypertension.  5. Morbid obesity.   DISCHARGE DIAGNOSES:  1. Intrauterine pregnancy at term.  2. Premature rupture of the membranes.  3. Early labor.  4. History of recent development of pregnancy-induced hypertension.  5. Morbid obesity.  6. Status post primary low transverse cesarean section.  7. Breast and bottle feeding.  8. Infant under evaluation secondary to diagnosis of tetralogy of Fallot.  9. The patient plans condoms and foam for contraception.   PROCEDURES THIS ADMISSION:  Primary low transverse cesarean section on  October 25, 2003 by Dr. Jaymes Graff for nonreassuring fetal heart rate  tracing for delivery of a viable female infant named Nehemiah who had Apgars  of 9 and 9 and a cord gas of 7.28 and weighing 7 pounds 14 ounces.   HOSPITAL COURSE:  Ms. Antosh is a 30 year old single black female gravida  1 para 0 at 72 and three-sevenths weeks who presented complaining of leaking  fluid and was admitted in active labor 4-5 cm with ruptured membranes.  She  progressed to complete by 8:30 a.m. on the morning of October 25, 2003 and  was laboring down whereupon she developed bradycardia and was taken to the  OR after having the fetal heart rate in the 60s and 70s for approximately 8  minutes.  By the time she did reach the OR or was prepped for the cesarean  section the fetal heart rate was more  reassuring.  However, they continued  to progress with a cesarean section and delivered a viable female infant named  Nehemiah who weighed 7 pounds 14 ounces and had Apgars of 9 and 9 and a cord  gas of 7.28, attended in delivery by Dr. Jaymes Graff and Mack Guise  C.N.M.  Postoperatively the patient has done well.  She is ambulating,  voiding, and eating without difficulty.  She is tolerating a regular diet.  She has been afebrile throughout her recent postoperative stay though  initially she did have a fever of 101 at about 7 p.m. on February 28.  Her  infant was diagnosed with tetralogy of Fallot on October 27, 2003 and it has  been under close observation though seems to be compensating well and they  plan to delay surgery until the baby is a little bit larger.  She has  continued to  have elevated blood pressures and is now on labetalol 100 mg  p.o. b.i.d.  Her blood pressures are in a good range at this point on that  medication.  They are 130s to 150s over 70s to 90s and she is deemed ready  for discharge today.   DISCHARGE INSTRUCTIONS:  As per the [facility] handout and to  call for signs or symptoms of preeclampsia, infection, or as needed.   DISCHARGE MEDICATIONS:  1. Labetalol 100 mg p.o. b.i.d.  2. Tylox one to two p.o. q.4-6h. p.r.n. for pain.  3. Motrin 600 mg p.o. q.6h. p.r.n. for pain.  4. Prenatal vitamins daily.   DISCHARGE LABORATORY DATA:  Her discharge urine is negative for protein.  Her hemoglobin is 9.7, her wbc count is 9.7, and her platelets are 308.  Her  AST is 39, her ALT is 24.   DISCHARGE FOLLOW-UP:  Will be on Monday, November 02, 2003 to have her staples  discontinued and her blood pressure checked.     Concha Pyo. Duplantis, C.N.M.              Osborn Coho, M.D.    SJD/MEDQ  D:  10/29/2003  T:  10/30/2003  Job:  [id number]

## 2011-01-13 NOTE — H&P (Signed)
Natasha Rose, Natasha Rose                       ACCOUNT NO.:  [id number]   MEDICAL RECORD NO.:  [id number]                   PATIENT TYPE:  INP   LOCATION:  9165                                 FACILITY:  [facility]   PHYSICIAN:  Naima A. Dillard, M.D.              DATE OF BIRTH:  Dec 18, 1980   DATE OF ADMISSION:  10/24/2003  DATE OF DISCHARGE:                                HISTORY & PHYSICAL   CHIEF COMPLAINT:  This is a 30 year old gravida 1, para 0 at 38-3/7 weeks  who presents with leaking fluid since 2030 hr, with bloody show and onset of  uterine contractions soon thereafter.  She reports positive fetal movement.   PREGNANCY RISKS:  She reports positive fetal movement.  The pregnancy has  been followed by the midwife service and remarkable for:  1. Morbid obesity.  2. First trimester spotting.  3. Sickle cell trait.  4. First trimester BV and UTI.  5. Group B strep pending.   OBSTETRICAL HISTORY:  The patient is a primigravida.   PAST MEDICAL HISTORY:  1. Childhood varicella.  2. Morbid obesity.  3. History of anemia.   FAMILY HISTORY:  Mother with anemia.  Grandfather with diabetes.   GENETIC HISTORY:  A patient who has sickle cell trait.   SOCIAL HISTORY:  The patient is single.  The father of the baby is not  currently present.  She is of the [place] faith.  She works as a Conservation officer, nature.  She denies any alcohol, tobacco or drug use.   HISTORY OF CURRENT PREGNANCY:  The patient entered care at nine weeks  gestation.  Her pregnancy was remarkable for an abnormal Pap smear, for  which she had a colposcopy performed.  She consistently measured size  greater than dates; however, this was possibly due to her abdominal size.  She had decreased weight gain throughout the pregnancy, but an ultrasound  measurements showed greater than 95th percentile for growth.  The cervix is  3.0 cm long at 24 weeks.  She developed anemia at mid pregnancy and was  placed on iron supplementation.   Her Pap smear was repeated at 30 weeks.  Her latest ultrasound is unavailable on the records.   PRENATAL LABS:  Hemoglobin 10.7, platelets 379.  Blood type 0 positive.  Antibody screen negative.  Sickle cell positive for trait.  RPR nonreactive.  Hepatitis negative.  HIV declined.  Gonorrhea negative.  Chlamydia negative.  Cystic fibrosis declined.  Group B strep results pending.   OBJECTIVE DATA:  VITAL SIGNS:  Stable.  Afebrile.  HEENT:  Within normal limits.  NECK:  Thyroid normal.  CHEST:  Clear to auscultation.  HEART:  Regular rate and rhythm.  ABDOMEN:  Morbidly obese.  Unable to fully ascertain fetal size; however,  the baby does [place] large.  FETAL MONITORING:  Fetal heart rate per monitor denotes a reactive tracing,  with uterine contractions every 2-3 min, which are moderate  in strength.  PELVIC:  Speculum examination reveals grossly ruptured membranes with blood  tinge.  Cervix is 4-5 cm, 85% effaced, -1 station with a vertex  presentation.  EXTREMITIES:  Within normal limits.   ASSESSMENT:  1. Intrauterine pregnancy at 38-3/7 weeks.  2. Premature rupture of membranes at term.  3. Early active labor.   PLAN:  1. Admit to birthing suite, Dr. Normand Sloop notified.  2. Routine CNM orders.  3. Epidural p.r.n.  4. Anticipate spontaneous vaginal delivery.     Natasha Rose, C.N.M.                 Naima A. Normand Sloop, M.D.    MLW/MEDQ  D:  10/24/2003  T:  10/25/2003  Job:  [id number]

## 2011-05-19 LAB — CBC
HCT: 30.6 — ABNORMAL LOW
MCV: 74.6 — ABNORMAL LOW
Platelets: 419 — ABNORMAL HIGH
RDW: 18.4 — ABNORMAL HIGH

## 2011-05-19 LAB — I-STAT 8, (EC8 V) (CONVERTED LAB)
BUN: 11
Bicarbonate: 23.3
Glucose, Bld: 101 — ABNORMAL HIGH
Hemoglobin: 11.6 — ABNORMAL LOW
Sodium: 135

## 2011-05-19 LAB — D-DIMER, QUANTITATIVE: D-Dimer, Quant: 0.55 — ABNORMAL HIGH

## 2011-05-19 LAB — DIFFERENTIAL
Basophils Absolute: 0.1
Eosinophils Absolute: 0.2
Eosinophils Relative: 2
Lymphs Abs: 2.9

## 2011-05-19 LAB — POCT CARDIAC MARKERS
CKMB, poc: <1 — ABNORMAL LOW
Myoglobin, poc: 92
Operator id: 270651

## 2012-09-06 ENCOUNTER — Inpatient Hospital Stay (HOSPITAL_COMMUNITY)
Admission: AD | Admit: 2012-09-06 | Discharge: 2012-09-06 | Disposition: A | Discharge status: Home / Self Care | Payer: Self-pay | Source: Ambulatory Visit | Attending: Obstetrics & Gynecology | Admitting: Obstetrics & Gynecology

## 2012-09-06 ENCOUNTER — Encounter (HOSPITAL_COMMUNITY): Payer: Self-pay

## 2012-09-06 DIAGNOSIS — N949 Unspecified condition associated with female genital organs and menstrual cycle: Secondary | ICD-10-CM | POA: Insufficient documentation

## 2012-09-06 DIAGNOSIS — N760 Acute vaginitis: Secondary | ICD-10-CM | POA: Insufficient documentation

## 2012-09-06 DIAGNOSIS — B373 Candidiasis of vulva and vagina: Secondary | ICD-10-CM

## 2012-09-06 DIAGNOSIS — B3731 Acute candidiasis of vulva and vagina: Secondary | ICD-10-CM | POA: Insufficient documentation

## 2012-09-06 DIAGNOSIS — A5901 Trichomonal vulvovaginitis: Secondary | ICD-10-CM | POA: Insufficient documentation

## 2012-09-06 DIAGNOSIS — B9689 Other specified bacterial agents as the cause of diseases classified elsewhere: Secondary | ICD-10-CM | POA: Insufficient documentation

## 2012-09-06 DIAGNOSIS — A499 Bacterial infection, unspecified: Secondary | ICD-10-CM | POA: Insufficient documentation

## 2012-09-06 HISTORY — DX: Essential (primary) hypertension: I10

## 2012-09-06 LAB — WET PREP, GENITAL: Trich, Wet Prep: NONE SEEN

## 2012-09-06 LAB — URINE MICROSCOPIC-ADD ON

## 2012-09-06 LAB — URINALYSIS, ROUTINE W REFLEX MICROSCOPIC
Glucose, UA: NEGATIVE mg/dL
Nitrite: NEGATIVE
Specific Gravity, Urine: 1.025 (ref 1.005–1.030)
pH: 5.5 (ref 5.0–8.0)

## 2012-09-06 MED ORDER — FLUCONAZOLE 150 MG PO TABS: 150.0000 mg | ORAL_TABLET | Freq: Once | ORAL | Status: DC

## 2012-09-06 MED ORDER — METRONIDAZOLE 500 MG PO TABS: 500.0000 mg | ORAL_TABLET | Freq: Two times a day (BID) | ORAL | Status: DC

## 2012-09-06 NOTE — MAU Provider Note (Signed)
History     CSN: [id number]  Arrival date and time: 09/06/12 1478   First Provider Initiated Contact with Patient 09/06/12 2025      Chief Complaint  Patient presents with  . Vaginal Pain   HPI Ms. Natasha Rose is a 32 y.o. G4 P3013 who presents to MAU today with complaint of vaginal irritation. The patient states that she began to have a discharge which she felt was a yeast infection on Saturday. She started using a generic Monistat on Sunday and now feels an increase in irritation of the vaginal area. She has been having burning which started yesterday. She has burning with urination and increased frequency of urination. She denies urgency, pelvic pain, back pain, fever, or N/V/D.   OB History    Grav Para Term Preterm Abortions TAB SAB Ect Mult Living   4 3   1 1    3       Past Medical History  Diagnosis Date  . Hypertension     Past Surgical History  Procedure Date  . Tubal ligation   . Cesarean section   . Cholecystectomy     No family history on file.  History  Substance Use Topics  . Smoking status: Never Smoker   . Smokeless tobacco: Not on file  . Alcohol Use: No    Allergies: No Known Allergies  Prescriptions prior to admission  Medication Sig Dispense Refill  . aspirin-acetaminophen-caffeine (EXCEDRIN MIGRAINE) 250-250-65 MG per tablet Take 1 tablet by mouth every 6 (six) hours as needed. headache      . Multiple Vitamins-Minerals (MULTIVITAMIN WITH MINERALS) tablet Take 1 tablet by mouth daily.        ROS All negative unless otherwise noted in HPI Physical Exam   Blood pressure 147/106, pulse 76, temperature 98.1 F (36.7 C), resp. rate 20, height 5\' 5"  (1.651 m), weight 352 lb 6.4 oz (159.848 kg), last menstrual period 08/11/2012.  Physical Exam  Constitutional: She is oriented to person, place, and time. She appears well-developed and well-nourished. No distress.  HENT:  Head: Normocephalic and atraumatic.  Cardiovascular: Normal rate,  regular rhythm and normal heart sounds.   Respiratory: Effort normal and breath sounds normal. No respiratory distress.  GI: Soft. Bowel sounds are normal. She exhibits no distension and no mass. There is no tenderness. There is no rebound and no guarding.  Genitourinary: Vagina normal. Uterus is not enlarged and not tender. Cervix exhibits no motion tenderness and no friability. Discharge: small amount of thick white discharge noted on the cervix. Right adnexum displays no mass and no tenderness. Left adnexum displays no mass and no tenderness.  Neurological: She is alert and oriented to person, place, and time.  Skin: Skin is warm and dry. No erythema.  Psychiatric: She has a normal mood and affect.   Results for orders placed during the hospital encounter of 09/06/12 (from the past 24 hour(s))  URINALYSIS, ROUTINE W REFLEX MICROSCOPIC     Status: Abnormal   Collection Time   09/06/12  7:40 PM      Component Value Range   Color, Urine YELLOW  YELLOW   APPearance CLEAR  CLEAR   Specific Gravity, Urine 1.025  1.005 - 1.030   pH 5.5  5.0 - 8.0   Glucose, UA NEGATIVE  NEGATIVE mg/dL   Hgb urine dipstick NEGATIVE  NEGATIVE   Bilirubin Urine NEGATIVE  NEGATIVE   Ketones, ur NEGATIVE  NEGATIVE mg/dL   Protein, ur NEGATIVE  NEGATIVE mg/dL  Urobilinogen, UA 0.2  0.0 - 1.0 mg/dL   Nitrite NEGATIVE  NEGATIVE   Leukocytes, UA MODERATE (*) NEGATIVE  URINE MICROSCOPIC-ADD ON     Status: Abnormal   Collection Time   09/06/12  7:40 PM      Component Value Range   Squamous Epithelial / LPF FEW (*) RARE   WBC, UA 21-50  <3 WBC/hpf   RBC / HPF 3-6  <3 RBC/hpf   Bacteria, UA FEW (*) RARE   Urine-Other TRICHOMONAS PRESENT    WET PREP, GENITAL     Status: Abnormal   Collection Time   09/06/12  8:08 PM      Component Value Range   Yeast Wet Prep HPF POC NONE SEEN  NONE SEEN   Trich, Wet Prep NONE SEEN  NONE SEEN   Clue Cells Wet Prep HPF POC FEW (*) NONE SEEN   WBC, Wet Prep HPF POC FEW (*) NONE  SEEN    MAU Course  Procedures  MDM UA, Wet prep and GC/Chlamydia sent  Assessment and Plan  A: Yeast vaginitis Bacterial vaginosis Trichomonas  P: Discharge patient GC/Chlamyida pending Rx for diflucan and flagyl sent to patient's pharmacy Discussed using OTC anti-fungal mixed with hydrocortisone cream on the external genitalia only for relief Discussed using probiotics, gentle soaps, cleansers and lotions to avoid recurrent BV Patient may return to MAU as needed  Freddi Starr, PA-C 09/06/2012, 8:25 PM

## 2012-09-06 NOTE — MAU Note (Signed)
Thought I had yeast infection. Been treating since Sunday, now it's worse. Burning when I void and swollen. Using generic Monistat

## 2012-09-08 LAB — URINE CULTURE

## 2012-09-09 ENCOUNTER — Telehealth (HOSPITAL_COMMUNITY): Payer: Self-pay | Admitting: *Deleted

## 2012-09-09 NOTE — Telephone Encounter (Signed)
Telephone call to patient regarding positive trich, patient not in, left message for her to call.  Treatment already prescribed by Joseph Berkshire, PA, sent directly to pharmacy.  Patient needs to be notified and instructed to complete Rx.

## 2012-09-09 NOTE — Telephone Encounter (Signed)
Patient returned call, notified her of + Trich in urine.  Instructed patient to pick up additional Rx and her pharmacy.

## 2014-06-29 ENCOUNTER — Encounter (HOSPITAL_COMMUNITY): Payer: Self-pay

## 2015-10-16 ENCOUNTER — Inpatient Hospital Stay (HOSPITAL_COMMUNITY)
Admission: AD | Admit: 2015-10-16 | Discharge: 2015-10-16 | Disposition: A | Discharge status: Home / Self Care | Payer: 59 | Source: Ambulatory Visit | Attending: Obstetrics and Gynecology | Admitting: Obstetrics and Gynecology

## 2015-10-16 ENCOUNTER — Encounter (HOSPITAL_COMMUNITY): Payer: Self-pay | Admitting: *Deleted

## 2015-10-16 DIAGNOSIS — N912 Amenorrhea, unspecified: Secondary | ICD-10-CM | POA: Insufficient documentation

## 2015-10-16 DIAGNOSIS — I10 Essential (primary) hypertension: Secondary | ICD-10-CM | POA: Insufficient documentation

## 2015-10-16 DIAGNOSIS — J069 Acute upper respiratory infection, unspecified: Secondary | ICD-10-CM | POA: Diagnosis not present

## 2015-10-16 LAB — URINALYSIS, ROUTINE W REFLEX MICROSCOPIC
Bilirubin Urine: NEGATIVE
GLUCOSE, UA: NEGATIVE mg/dL
HGB URINE DIPSTICK: NEGATIVE
Ketones, ur: NEGATIVE mg/dL
LEUKOCYTES UA: NEGATIVE
Nitrite: NEGATIVE
PH: 6 (ref 5.0–8.0)
Protein, ur: NEGATIVE mg/dL
Specific Gravity, Urine: 1.01 (ref 1.005–1.030)

## 2015-10-16 LAB — CBC
HEMATOCRIT: 36.6 % (ref 36.0–46.0)
HEMOGLOBIN: 12.1 g/dL (ref 12.0–15.0)
MCH: 24 pg — ABNORMAL LOW (ref 26.0–34.0)
MCHC: 33.1 g/dL (ref 30.0–36.0)
MCV: 72.6 fL — ABNORMAL LOW (ref 78.0–100.0)
Platelets: 388 10*3/uL (ref 150–400)
RBC: 5.04 MIL/uL (ref 3.87–5.11)
RDW: 18 % — ABNORMAL HIGH (ref 11.5–15.5)
WBC: 9.9 10*3/uL (ref 4.0–10.5)

## 2015-10-16 LAB — ABO/RH: ABO/RH(D): O POS

## 2015-10-16 LAB — POCT PREGNANCY, URINE: Preg Test, Ur: NEGATIVE

## 2015-10-16 LAB — HCG, QUANTITATIVE, PREGNANCY

## 2015-10-16 MED ORDER — HYDROCHLOROTHIAZIDE 25 MG PO TABS: 25.0000 mg | ORAL_TABLET | Freq: Every day | ORAL | Status: AC

## 2015-10-16 NOTE — MAU Note (Signed)
Patient presents with no period since 9/16, too 2 home tests one positive and one negative, feels movement in abdomen, no vaginal bleeding, no vaginal discharge.

## 2015-10-16 NOTE — MAU Provider Note (Signed)
History     CSN: [id number]  Arrival date and time: 10/16/15 1507   First Provider Initiated Contact with Patient 10/16/15 1605      Chief Complaint  Patient presents with  . Amenorrhea   HPI Natasha Rose 35 y.o.  Has not had a period since September and thought she feels movement in her abdomen.  Did several pregnancy tests at home - some were positive and some were negative.  Is concerned she may be pregnant so came in for further evaluation.  Has a cold that started yesterday.  Does not want any medication for her cold.  Has been to [facility] but not in 2 years.  Says she was on medication for hypertension but that they weaned her off the medication.  OB History    Gravida Para Term Preterm AB TAB SAB Ectopic Multiple Living   0    3      Past Medical History  Diagnosis Date  . Hypertension     Past Surgical History  Procedure Laterality Date  . Tubal ligation    . Cesarean section    . Cholecystectomy      History reviewed. No pertinent family history.  Social History  Substance Use Topics  . Smoking status: Never Smoker   . Smokeless tobacco: None  . Alcohol Use: No    Allergies: No Known Allergies  Prescriptions prior to admission  Medication Sig Dispense Refill Last Dose  . Multiple Vitamins-Minerals (MULTIVITAMIN WITH MINERALS) tablet Take 1 tablet by mouth daily.   Past Week at Unknown time  . fluconazole (DIFLUCAN) 150 MG tablet Take 1 tablet (150 mg total) by mouth once. (Patient not taking: Reported on 10/16/2015) 1 tablet 0 Completed Course at Unknown time  . metroNIDAZOLE (FLAGYL) 500 MG tablet Take 1 tablet (500 mg total) by mouth 2 (two) times daily. (Patient not taking: Reported on 10/16/2015) 14 tablet 0 Completed Course at Unknown time    Review of Systems  Constitutional: Negative for fever.  HENT: Positive for congestion.   Respiratory: Negative for cough.   Gastrointestinal: Negative for nausea and vomiting.    Physical Exam   Blood pressure 148/109, pulse 102, temperature 99.7 F (37.6 C), temperature source Oral, resp. rate 20, height  (1.651 m), weight 400 lb 12.8 oz (181.802 kg), last menstrual period 05/13/2015.  Physical Exam  Nursing note and vitals reviewed. Constitutional: She is oriented to person, place, and time. She appears well-developed and well-nourished.  Morbidly obese  HENT:  Head: Normocephalic.  Eyes: EOM are normal.  Neck: Neck supple.  GI: Soft. There is no tenderness. There is no rebound and no guarding.  Abdomen is quite large and unable to palpate any masses  Musculoskeletal: Normal range of motion.  Neurological: She is alert and oriented to person, place, and time.  Skin: Skin is warm and dry.  Psychiatric: She has a normal mood and affect.    MAU Course  Procedures Results for orders placed or performed during the hospital encounter of 10/16/15 (from the past 24 hour(s))  Urinalysis, Routine w reflex microscopic (not at [facility])     Status: None   Collection Time: 10/16/15  3:25 PM  Result Value Ref Range   Color, Urine YELLOW YELLOW   APPearance CLEAR CLEAR   Specific Gravity, Urine 1.010 1.005 - 1.030   pH 6.0 5.0 - 8.0   Glucose, UA NEGATIVE NEGATIVE mg/dL   Hgb urine dipstick NEGATIVE NEGATIVE  Bilirubin Urine NEGATIVE NEGATIVE   Ketones, ur NEGATIVE NEGATIVE mg/dL   Protein, ur NEGATIVE NEGATIVE mg/dL   Nitrite NEGATIVE NEGATIVE   Leukocytes, UA NEGATIVE NEGATIVE  Pregnancy, urine POC     Status: None   Collection Time: 10/16/15  3:37 PM  Result Value Ref Range   Preg Test, Ur NEGATIVE NEGATIVE  CBC     Status: Abnormal   Collection Time: 10/16/15  4:15 PM  Result Value Ref Range   WBC 9.9 4.0 - 10.5 K/uL   RBC 5.04 3.87 - 5.11 MIL/uL   Hemoglobin 12.1 12.0 - 15.0 g/dL   HCT 16.1 09.6 - 04.5 %   MCV 72.6 (L) 78.0 - 100.0 fL   MCH 24.0 (L) 26.0 - 34.0 pg   MCHC 33.1 30.0 - 36.0 g/dL   RDW 40.9 (H) 81.1 - 91.4 %   Platelets 388 150  - 400 K/uL  ABO/Rh     Status: None   Collection Time: 10/16/15  4:15 PM  Result Value Ref Range   ABO/RH(D) O POS   hCG, quantitative, pregnancy     Status: None   Collection Time: 10/16/15  4:15 PM  Result Value Ref Range   hCG, Beta Chain, Quant, S <1 <5 mIU/mL    MDM Rechecked bp and is is not any lower.  Diastolic is 106 on recheck.  Does not remember medication that she was taking for her blood pressure but that it did have HCTZ in it.  Will eprescribe HCTZ so she can begin it but strongly advised her to see her doctor this week and have her blood pressure reevaluated.  Informed patient that she is not pregnant.    Assessment and Plan  Upper respiratory infection Hypertension - currently untreated No pregnancy is identified Likely has gas which is causing the sensations in her abdomen Morbid obesity  Plan See your doctor about your blood pressure and for a check up.   They may need to monitor you since you have not had a period in several months. Can take Coricidin HPB if needed for your cold. Pick up HCTZ at your pharmacy and take one by mouth daily.  Natasha Rose 10/16/2015, 5:45 PM

## 2015-10-16 NOTE — Discharge Instructions (Signed)
You are not pregnant today. See your doctor about your blood pressure and for a check up.   They may need to monitor you since you have not had a period in several months. Can take Coricidin HPB if needed for your cold. Pick up HCTZ at your pharmacy and take one by mouth daily.
